# Patient Record
Sex: Female | Born: 1975 | Race: Black or African American | Hispanic: No | Marital: Married | Smoking: Never smoker
Health system: Southern US, Community
[De-identification: ages and names within clinical notes are randomized; demographics above are authoritative.]

## PROBLEM LIST (undated history)

## (undated) DIAGNOSIS — R51 Headache: Secondary | ICD-10-CM

## (undated) DIAGNOSIS — R519 Headache, unspecified: Secondary | ICD-10-CM

## (undated) HISTORY — PX: MYOMECTOMY: SHX85

## (undated) HISTORY — DX: Headache: R51

## (undated) HISTORY — DX: Headache, unspecified: R51.9

---

## 2001-05-01 ENCOUNTER — Other Ambulatory Visit: Admission: RE | Admit: 2001-05-01 | Discharge: 2001-05-01 | Payer: Self-pay | Admitting: Internal Medicine

## 2002-01-07 ENCOUNTER — Ambulatory Visit (HOSPITAL_COMMUNITY): Admission: RE | Admit: 2002-01-07 | Discharge: 2002-01-07 | Payer: Self-pay | Admitting: Internal Medicine

## 2002-01-07 ENCOUNTER — Encounter: Payer: Self-pay | Admitting: Internal Medicine

## 2002-08-02 ENCOUNTER — Ambulatory Visit (HOSPITAL_COMMUNITY): Admission: RE | Admit: 2002-08-02 | Discharge: 2002-08-02 | Payer: Self-pay | Admitting: *Deleted

## 2002-08-02 ENCOUNTER — Encounter: Payer: Self-pay | Admitting: *Deleted

## 2003-05-30 ENCOUNTER — Encounter: Payer: Self-pay | Admitting: Internal Medicine

## 2003-05-30 ENCOUNTER — Ambulatory Visit (HOSPITAL_COMMUNITY): Admission: RE | Admit: 2003-05-30 | Discharge: 2003-05-30 | Payer: Self-pay | Admitting: Internal Medicine

## 2003-09-22 ENCOUNTER — Ambulatory Visit (HOSPITAL_COMMUNITY): Admission: RE | Admit: 2003-09-22 | Discharge: 2003-09-22 | Payer: Self-pay | Admitting: *Deleted

## 2004-03-22 ENCOUNTER — Inpatient Hospital Stay (HOSPITAL_COMMUNITY): Admission: RE | Admit: 2004-03-22 | Discharge: 2004-03-25 | Payer: Self-pay | Admitting: *Deleted

## 2004-03-22 ENCOUNTER — Encounter (INDEPENDENT_AMBULATORY_CARE_PROVIDER_SITE_OTHER): Payer: Self-pay | Admitting: Specialist

## 2006-01-08 ENCOUNTER — Inpatient Hospital Stay (HOSPITAL_COMMUNITY): Admission: AD | Admit: 2006-01-08 | Discharge: 2006-01-08 | Payer: Self-pay | Admitting: Obstetrics and Gynecology

## 2006-02-10 ENCOUNTER — Inpatient Hospital Stay (HOSPITAL_COMMUNITY): Admission: AD | Admit: 2006-02-10 | Discharge: 2006-02-10 | Payer: Self-pay | Admitting: Obstetrics and Gynecology

## 2006-02-11 ENCOUNTER — Observation Stay (HOSPITAL_COMMUNITY): Admission: AD | Admit: 2006-02-11 | Discharge: 2006-02-12 | Payer: Self-pay | Admitting: Obstetrics and Gynecology

## 2006-03-09 ENCOUNTER — Inpatient Hospital Stay (HOSPITAL_COMMUNITY): Admission: AD | Admit: 2006-03-09 | Discharge: 2006-03-09 | Payer: Self-pay | Admitting: Obstetrics and Gynecology

## 2006-03-10 ENCOUNTER — Inpatient Hospital Stay (HOSPITAL_COMMUNITY): Admission: AD | Admit: 2006-03-10 | Discharge: 2006-03-10 | Payer: Self-pay | Admitting: *Deleted

## 2006-04-12 ENCOUNTER — Inpatient Hospital Stay (HOSPITAL_COMMUNITY): Admission: AD | Admit: 2006-04-12 | Discharge: 2006-04-16 | Payer: Self-pay | Admitting: Obstetrics and Gynecology

## 2006-04-12 ENCOUNTER — Encounter (INDEPENDENT_AMBULATORY_CARE_PROVIDER_SITE_OTHER): Payer: Self-pay | Admitting: *Deleted

## 2006-04-17 ENCOUNTER — Encounter: Admission: RE | Admit: 2006-04-17 | Discharge: 2006-05-02 | Payer: Self-pay | Admitting: *Deleted

## 2006-07-10 ENCOUNTER — Encounter: Admission: RE | Admit: 2006-07-10 | Discharge: 2006-08-09 | Payer: Self-pay | Admitting: *Deleted

## 2006-08-10 ENCOUNTER — Encounter: Admission: RE | Admit: 2006-08-10 | Discharge: 2006-09-08 | Payer: Self-pay | Admitting: *Deleted

## 2006-09-09 ENCOUNTER — Encounter: Admission: RE | Admit: 2006-09-09 | Discharge: 2006-09-27 | Payer: Self-pay | Admitting: *Deleted

## 2007-06-20 ENCOUNTER — Inpatient Hospital Stay (HOSPITAL_COMMUNITY): Admission: RE | Admit: 2007-06-20 | Discharge: 2007-06-22 | Payer: Self-pay | Admitting: *Deleted

## 2007-06-25 ENCOUNTER — Encounter: Admission: RE | Admit: 2007-06-25 | Discharge: 2007-07-24 | Payer: Self-pay | Admitting: *Deleted

## 2007-07-25 ENCOUNTER — Encounter: Admission: RE | Admit: 2007-07-25 | Discharge: 2007-08-24 | Payer: Self-pay | Admitting: *Deleted

## 2007-08-25 ENCOUNTER — Encounter: Admission: RE | Admit: 2007-08-25 | Discharge: 2007-09-23 | Payer: Self-pay | Admitting: *Deleted

## 2007-09-24 ENCOUNTER — Encounter: Admission: RE | Admit: 2007-09-24 | Discharge: 2007-10-24 | Payer: Self-pay | Admitting: *Deleted

## 2007-10-25 ENCOUNTER — Encounter: Admission: RE | Admit: 2007-10-25 | Discharge: 2007-11-24 | Payer: Self-pay | Admitting: *Deleted

## 2007-11-25 ENCOUNTER — Encounter: Admission: RE | Admit: 2007-11-25 | Discharge: 2007-12-24 | Payer: Self-pay | Admitting: *Deleted

## 2007-12-26 ENCOUNTER — Encounter: Admission: RE | Admit: 2007-12-26 | Discharge: 2008-01-24 | Payer: Self-pay | Admitting: *Deleted

## 2008-01-25 ENCOUNTER — Encounter: Admission: RE | Admit: 2008-01-25 | Discharge: 2008-02-24 | Payer: Self-pay | Admitting: *Deleted

## 2008-02-25 ENCOUNTER — Encounter: Admission: RE | Admit: 2008-02-25 | Discharge: 2008-03-25 | Payer: Self-pay | Admitting: *Deleted

## 2008-03-26 ENCOUNTER — Encounter: Admission: RE | Admit: 2008-03-26 | Discharge: 2008-04-25 | Payer: Self-pay | Admitting: *Deleted

## 2008-04-26 ENCOUNTER — Encounter: Admission: RE | Admit: 2008-04-26 | Discharge: 2008-05-26 | Payer: Self-pay | Admitting: *Deleted

## 2008-05-27 ENCOUNTER — Encounter: Admission: RE | Admit: 2008-05-27 | Discharge: 2008-06-26 | Payer: Self-pay | Admitting: Obstetrics and Gynecology

## 2008-06-27 ENCOUNTER — Encounter: Admission: RE | Admit: 2008-06-27 | Discharge: 2008-07-27 | Payer: Self-pay | Admitting: Obstetrics and Gynecology

## 2008-07-28 ENCOUNTER — Encounter: Admission: RE | Admit: 2008-07-28 | Discharge: 2008-08-08 | Payer: Self-pay | Admitting: Obstetrics and Gynecology

## 2010-11-17 ENCOUNTER — Other Ambulatory Visit: Payer: Self-pay | Admitting: Obstetrics & Gynecology

## 2011-02-08 NOTE — Discharge Summary (Signed)
[name], [name]                 ACCOUNT NO.:  [id number]   MEDICAL RECORD NO.:  [id number]          PATIENT TYPE:  INP   LOCATION:  9133                          FACILITY:  [facility]   PHYSICIAN:  Gerri Spore B. Earlene Plater, M.D.  DATE OF BIRTH:  [birth year]   DATE OF ADMISSION:  06/20/2007  DATE OF DISCHARGE:  06/22/2007                               DISCHARGE SUMMARY   ADMISSION DIAGNOSES:  1. Thirty-seven intrauterine pregnancy.  2. Previous cesarean section and previous myomectomy.   DISCHARGE DIAGNOSES:  1. Thirty-seven intrauterine pregnancy.  2. Previous cesarean section and previous myomectomy.   PROCEDURES:  Repeat low transverse C-section.   HISTORY OF PRESENT ILLNESS:  A 35 year old African-American female  gravida 3, para 1, A 1 with above history who presented for repeat C-  section.   HOSPITAL COURSE:  On day of admission, the patient underwent repeat low  transverse C-section.  Findings included viable female 8 pounds 2 ounces,  Apgars 8 and 8.  There were adhesions of the bladder to the uterus and  anterior abdominal wall which were taken down sharply without  difficulty.   Postoperatively, the patient rapidly regained her ability to ambulate,  void, and tolerate a regular diet.  She was discharged home on the 2nd  postoperative day in satisfactory condition.   DISCHARGE MEDICATIONS:  Tylox 1-2 p.o. q.4-6h. p.r.n. pain.   DISCHARGE INSTRUCTIONS:  Per booklet.   FOLLOW UP:  Six weeks, [facility].   DISPOSITION AT DISCHARGE:  Satisfactory.      Gerri Spore B. Earlene Plater, M.D.  Electronically Signed     WBD/MEDQ  D:  07/21/2007  T:  07/22/2007  Job:  [id number]

## 2011-02-08 NOTE — Op Note (Signed)
[name], [name]                 ACCOUNT NO.:  [id number]   MEDICAL RECORD NO.:  [id number]          PATIENT TYPE:  INP   LOCATION:  9133                          [facility]ILITY:  [facility]   PHYSICIAN:  Gerri Spore B. Earlene Plater, M.D.  DATE OF BIRTH:  June 05, 1976   DATE OF PROCEDURE:  06/20/2007  DATE OF DISCHARGE:                               OPERATIVE REPORT   PREOPERATIVE DIAGNOSIS:  1. 39 week intrauterine pregnancy.  2. Previous cesarean section and previous myomectomy.   POSTOPERATIVE DIAGNOSIS:  1. 39 week intrauterine pregnancy.  2. Previous cesarean section and previous myomectomy.   PROCEDURE:  Repeat low transverse cesarean section.   SURGEON:  Chester Holstein. Earlene Plater, M.D.   ASSISTANT:  Marlinda Mike, C.N.M.   ANESTHESIA:  Spinal.   FINDINGS:  Bladder adherent to the anterior abdominal wall and uterus,  taken down sharply. Viable female, Apgars 8 and 8, 8 pounds 2 ounces.   SPECIMENS:  None.   COMPLICATIONS:  None.   BLOOD LOSS:  800 mL.   INDICATIONS:  Patient with the above history for repeat C-section. The  patient was advised of the risks of surgery, including infection,  bleeding, and damage to surrounding organs.   PROCEDURE:  The patient was taken to the operating room and spinal  anesthesia obtained.  She was prepped and draped in a standard fashion.  A Foley catheter was inserted into the bladder.  A Pfannenstiel incision  was made through the previous scar.  The fascia was divided sharply and  the underlying rectus muscles dissected off sharply.  The posterior  sheath peritoneum elevated and entered sharply.  The bladder was noted  to be adherent to the uterus and anterior abdominal wall just inferior  to the point of entry.  The adhesions were taken down sharply.  Bladder  blade was inserted and bladder flap created sharply.  A uterine incision  was made in a low transverse fashion with a knife.  It was extended  laterally with bandage scissors.   Clear fluid at amniotomy,  vertex elevated through the incision, nose and  mouth suctioned with the bulb.  Nuchal cord x1 noted. Anterior shoulder,  which was the left, was delivered by flexion of the elbow to facilitate  delivery.  The remainder of the infant delivered without difficulty.  Cord clamped and cut and infant handed off to awaiting pediatricians.  1  gram Ancef given at cord clamp.  The placenta was removed by uterine  massage.  The uterus was cleared of all clots and debris.  The tubes and  ovaries were poorly visualized due to fibroid uterus, however, no  abnormalities by palpation.   The uterine incision was closed in a running lock stitch of 0 chromic.  A few bleeders in the midline were made hemostatic with interrupted  figure-of-eight stitches of the same suture.  The pelvis was irrigated.  The uterine incision and bladder flap inspected and were hemostatic.  The bladder was free of injury.  The fascia was closed with a running  stitch of 0 Vicryl.  The subcutaneous tissue was irrigated, made  hemostatic with the Bovie, and reapproximated with interrupted sutures  of 2-0 plain suture.  The skin was closed with staples.  The patient  tolerated the procedure well with no complications.  She was taken to  the recovery room awake, alert, and in stable condition.  All counts  correct per the operating room staff.      Gerri Spore B. Earlene Plater, M.D.  Electronically Signed     WBD/MEDQ  D:  06/20/2007  T:  06/20/2007  Job:  [id number]

## 2011-02-11 NOTE — H&P (Signed)
NAME:  [name], [name]                           ACCOUNT NO.:  [id number]   MEDICAL RECORD NO.:  [id number]                   PATIENT TYPE:  INP   LOCATION:  NA                                   [facility]  [facility]   PHYSICIAN:  [place] B. Earlene Plater, M.D.               DATE OF BIRTH:  1976-01-06   DATE OF ADMISSION:  DATE OF DISCHARGE:                                HISTORY & PHYSICAL   PREOPERATIVE DIAGNOSES:  1. Uterine fibroids.  2. Complex right ovarian cyst, probable endometrioma.  3. Pelvic adhesions.   HISTORY OF PRESENT ILLNESS:  A 35 year old African-American female, gravida  1, para 0, A 1, with a several month history of a right ovarian cyst which  appears consistent with endometrioma.  Also noted to have a large uterine  fibroid which is anterior and approximately 11 cm in diameter.  Previous HSG  has showed normal cavity and no evidence of tubal occlusion.  However, at  this time, the patient is bothered due to bulk-related symptoms of the  fibroid and is also interested in fertility and was noted to have pelvic  adhesions at time of diagnostic laparoscopy.  __________ bowel preparation  for laparotomy with abdominal myomectomy and ovarian cystectomy with lysis  of adhesions.   PAST MEDICAL HISTORY:  None.   PAST SURGICAL HISTORY:  Diagnostic laparoscopy as outlined above.   MEDICATIONS:  None.   ALLERGIES:  None.   SOCIAL HISTORY:  Noncontributory.   FAMILY HISTORY:  Diabetes, heart disease, and lung cancer.   REVIEW OF SYSTEMS:  Otherwise negative.   PHYSICAL EXAMINATION:  VITAL SIGNS:  The patient is noted to be afebrile  with stable vital signs.  GENERAL:  Alert and oriented, in no acute distress.  SKIN:  Warm, dry, with no lesions.  HEART:  Regular rate and rhythm.  LUNGS:  Clear to auscultation.  ABDOMEN:  Liver and spleen are normal.  There is a palpable mass to the  umbilicus consistent with known uterine fibroids.  PELVIC:  Large anterior fibroid palpable.   Otherwise, no abnormality.   ASSESSMENT:  Symptomatic uterine fibroid and persistent complex right  ovarian cyst.   PLAN:  Laparotomy after bowel prep with ovarian cystectomy, lysis of  adhesions, and abdominal myomectomy.  Operative risks discussed including  infection, bleeding, damage to bowel and bladder and surrounding organs and  potential need to convert to hysterectomy should life-threatening bleeding  arise.  In addition, it was discussed with the patient that it may not be  possible to completely treat the underlying pelvic pathology and that tubal  removal may be necessary as an ovarian removal should the clinical situation  necessitate.  All questions answered.  The patient wishes to proceed.  Gerri Spore B. Earlene Plater, M.D.    WBD/MEDQ  D:  03/21/2004  T:  03/21/2004  Job:  [id number]

## 2011-07-07 LAB — CBC
MCHC: 35.3
RBC: 3.12 — ABNORMAL LOW
RBC: 4.38
RDW: 13.4
WBC: 13.8 — ABNORMAL HIGH

## 2011-07-07 LAB — DIFFERENTIAL
Basophils Absolute: 0
Basophils Relative: 0
Neutro Abs: 7.2
Neutrophils Relative %: 74

## 2011-07-07 LAB — CCBB MATERNAL DONOR DRAW

## 2012-12-19 ENCOUNTER — Other Ambulatory Visit (HOSPITAL_COMMUNITY)
Admission: RE | Admit: 2012-12-19 | Discharge: 2012-12-19 | Disposition: A | Discharge status: Home / Self Care | Payer: BC Managed Care – PPO | Source: Ambulatory Visit | Attending: Obstetrics and Gynecology | Admitting: Obstetrics and Gynecology

## 2012-12-19 ENCOUNTER — Other Ambulatory Visit: Payer: Self-pay | Admitting: Obstetrics and Gynecology

## 2012-12-19 DIAGNOSIS — Z113 Encounter for screening for infections with a predominantly sexual mode of transmission: Secondary | ICD-10-CM | POA: Insufficient documentation

## 2012-12-19 DIAGNOSIS — Z1151 Encounter for screening for human papillomavirus (HPV): Secondary | ICD-10-CM | POA: Insufficient documentation

## 2012-12-19 DIAGNOSIS — Z01419 Encounter for gynecological examination (general) (routine) without abnormal findings: Secondary | ICD-10-CM | POA: Insufficient documentation

## 2013-01-29 ENCOUNTER — Other Ambulatory Visit: Payer: Self-pay | Admitting: Obstetrics and Gynecology

## 2013-08-02 ENCOUNTER — Other Ambulatory Visit: Payer: Self-pay | Admitting: Obstetrics and Gynecology

## 2013-08-02 ENCOUNTER — Other Ambulatory Visit (HOSPITAL_COMMUNITY)
Admission: RE | Admit: 2013-08-02 | Discharge: 2013-08-02 | Disposition: A | Discharge status: Home / Self Care | Payer: BC Managed Care – PPO | Source: Ambulatory Visit | Attending: Obstetrics and Gynecology | Admitting: Obstetrics and Gynecology

## 2013-08-02 DIAGNOSIS — Z01419 Encounter for gynecological examination (general) (routine) without abnormal findings: Secondary | ICD-10-CM | POA: Insufficient documentation

## 2013-08-02 DIAGNOSIS — R87810 Cervical high risk human papillomavirus (HPV) DNA test positive: Secondary | ICD-10-CM | POA: Insufficient documentation

## 2013-08-02 DIAGNOSIS — Z1151 Encounter for screening for human papillomavirus (HPV): Secondary | ICD-10-CM | POA: Insufficient documentation

## 2013-12-23 ENCOUNTER — Other Ambulatory Visit: Payer: Self-pay | Admitting: Obstetrics and Gynecology

## 2013-12-23 ENCOUNTER — Other Ambulatory Visit (HOSPITAL_COMMUNITY)
Admission: RE | Admit: 2013-12-23 | Discharge: 2013-12-23 | Disposition: A | Discharge status: Home / Self Care | Payer: BC Managed Care – PPO | Source: Ambulatory Visit | Attending: Obstetrics and Gynecology | Admitting: Obstetrics and Gynecology

## 2013-12-23 DIAGNOSIS — R87810 Cervical high risk human papillomavirus (HPV) DNA test positive: Secondary | ICD-10-CM | POA: Insufficient documentation

## 2013-12-23 DIAGNOSIS — Z1151 Encounter for screening for human papillomavirus (HPV): Secondary | ICD-10-CM | POA: Insufficient documentation

## 2013-12-23 DIAGNOSIS — Z113 Encounter for screening for infections with a predominantly sexual mode of transmission: Secondary | ICD-10-CM | POA: Insufficient documentation

## 2013-12-23 DIAGNOSIS — Z124 Encounter for screening for malignant neoplasm of cervix: Secondary | ICD-10-CM | POA: Insufficient documentation

## 2014-06-26 ENCOUNTER — Other Ambulatory Visit (HOSPITAL_COMMUNITY)
Admission: RE | Admit: 2014-06-26 | Discharge: 2014-06-26 | Disposition: A | Discharge status: Home / Self Care | Payer: BC Managed Care – PPO | Source: Ambulatory Visit | Attending: Obstetrics and Gynecology | Admitting: Obstetrics and Gynecology

## 2014-06-26 ENCOUNTER — Other Ambulatory Visit: Payer: Self-pay | Admitting: Obstetrics and Gynecology

## 2014-06-26 DIAGNOSIS — Z124 Encounter for screening for malignant neoplasm of cervix: Secondary | ICD-10-CM | POA: Diagnosis not present

## 2014-06-26 DIAGNOSIS — R87810 Cervical high risk human papillomavirus (HPV) DNA test positive: Secondary | ICD-10-CM | POA: Diagnosis present

## 2014-06-26 DIAGNOSIS — Z1151 Encounter for screening for human papillomavirus (HPV): Secondary | ICD-10-CM | POA: Insufficient documentation

## 2014-06-26 DIAGNOSIS — R87619 Unspecified abnormal cytological findings in specimens from cervix uteri: Secondary | ICD-10-CM | POA: Diagnosis present

## 2014-06-27 LAB — CYTOLOGY - PAP

## 2014-10-07 ENCOUNTER — Emergency Department (HOSPITAL_COMMUNITY)
Admission: EM | Admit: 2014-10-07 | Discharge: 2014-10-07 | Disposition: A | Discharge status: Home / Self Care | Payer: BC Managed Care – PPO | Attending: Emergency Medicine | Admitting: Emergency Medicine

## 2014-10-07 ENCOUNTER — Emergency Department (HOSPITAL_COMMUNITY): Payer: BC Managed Care – PPO

## 2014-10-07 ENCOUNTER — Encounter (HOSPITAL_COMMUNITY): Payer: Self-pay | Admitting: Adult Health

## 2014-10-07 DIAGNOSIS — R000 Tachycardia, unspecified: Secondary | ICD-10-CM | POA: Insufficient documentation

## 2014-10-07 DIAGNOSIS — R51 Headache: Secondary | ICD-10-CM | POA: Diagnosis present

## 2014-10-07 DIAGNOSIS — R519 Headache, unspecified: Secondary | ICD-10-CM

## 2014-10-07 DIAGNOSIS — M542 Cervicalgia: Secondary | ICD-10-CM | POA: Insufficient documentation

## 2014-10-07 DIAGNOSIS — R200 Anesthesia of skin: Secondary | ICD-10-CM | POA: Diagnosis not present

## 2014-10-07 DIAGNOSIS — Z791 Long term (current) use of non-steroidal anti-inflammatories (NSAID): Secondary | ICD-10-CM | POA: Insufficient documentation

## 2014-10-07 DIAGNOSIS — Z79899 Other long term (current) drug therapy: Secondary | ICD-10-CM | POA: Diagnosis not present

## 2014-10-07 LAB — BASIC METABOLIC PANEL
Anion gap: 9 (ref 5–15)
BUN: 11 mg/dL (ref 6–23)
CO2: 25 mmol/L (ref 19–32)
Calcium: 9.4 mg/dL (ref 8.4–10.5)
Chloride: 104 mEq/L (ref 96–112)
Creatinine, Ser: 1.11 mg/dL — ABNORMAL HIGH (ref 0.50–1.10)
GFR calc Af Amer: 71 mL/min — ABNORMAL LOW (ref >90)
GFR calc non Af Amer: 61 mL/min — ABNORMAL LOW (ref >90)
Glucose, Bld: 96 mg/dL (ref 70–99)
Potassium: 3.7 mmol/L (ref 3.5–5.1)
Sodium: 138 mmol/L (ref 135–145)

## 2014-10-07 LAB — CBC WITH DIFFERENTIAL/PLATELET
Basophils Absolute: 0 10*3/uL (ref 0.0–0.1)
Basophils Relative: 0 % (ref 0–1)
Eosinophils Absolute: 0.1 10*3/uL (ref 0.0–0.7)
Eosinophils Relative: 1 % (ref 0–5)
HCT: 41.1 % (ref 36.0–46.0)
Hemoglobin: 14.1 g/dL (ref 12.0–15.0)
Lymphocytes Relative: 40 % (ref 12–46)
Lymphs Abs: 2.9 10*3/uL (ref 0.7–4.0)
MCH: 31.7 pg (ref 26.0–34.0)
MCHC: 34.3 g/dL (ref 30.0–36.0)
MCV: 92.4 fL (ref 78.0–100.0)
Monocytes Absolute: 0.4 10*3/uL (ref 0.1–1.0)
Monocytes Relative: 5 % (ref 3–12)
Neutro Abs: 3.9 10*3/uL (ref 1.7–7.7)
Neutrophils Relative %: 54 % (ref 43–77)
Platelets: 296 10*3/uL (ref 150–400)
RBC: 4.45 MIL/uL (ref 3.87–5.11)
RDW: 13 % (ref 11.5–15.5)
WBC: 7.2 10*3/uL (ref 4.0–10.5)

## 2014-10-07 LAB — CBG MONITORING, ED: GLUCOSE-CAPILLARY: 94 mg/dL (ref 70–99)

## 2014-10-07 MED ORDER — ASPIRIN-ACETAMINOPHEN-CAFFEINE 250-250-65 MG PO TABS: 2.0000 | ORAL_TABLET | Freq: Four times a day (QID) | ORAL | Status: AC | PRN

## 2014-10-07 NOTE — ED Notes (Signed)
Pt made aware to return if symptoms worsen or if any life threatening symptoms occur.   

## 2014-10-07 NOTE — ED Notes (Signed)
Pt reports neck pain radiating into head and right face x1 week.  Pt states going to PCP yesterday and given antiinflammatory and muscle relaxer with relief of headache.  Pt denies loc, dizziness, vision changes.

## 2014-10-07 NOTE — ED Provider Notes (Signed)
CSN: [id number]     Arrival date & time 10/07/14  1643 History   First MD Initiated Contact with Patient 10/07/14 1658     Chief Complaint  Patient presents with  . Headache     (Consider location/radiation/quality/duration/timing/severity/associated sxs/prior Treatment) HPI  Pt is a 39yo female presenting to ED with c/o intermittent episodes of right sided neck pain and headache. Pt reports for 1 week, she has noticed right sided neck pain that radiates into her head causing a throbbing headache, 5/10 at worst.  Pt states "I don't feel dizzy or nauseated, but I just feel off. I can't explain it."  Pain in right side of neck also causes intermittent numbness on right side of face.  Denies change in vision, photophobia, or passing out.  She was seen by her PCP yesterday, given an antiinflammatory and muscle relaxer which did provide moderate relief of her headache but still reports "waves" of this "sensation" on the right side of her neck. Denies hx of migraines. Denies hx of HTN, hyperlipidemia, DM, CAD. Denies palpitations or SOB. Denies recent head injuries or illness including fever, n/v/d.   History reviewed. No pertinent past medical history. History reviewed. No pertinent past surgical history. History reviewed. No pertinent family history. History  Substance Use Topics  . Smoking status: Never Smoker   . Smokeless tobacco: Not on file  . Alcohol Use: No   OB History    No data available     Review of Systems  Constitutional: Negative for fever and chills.  HENT: Negative for congestion, sore throat, trouble swallowing and voice change.   Eyes: Negative for photophobia and visual disturbance.  Respiratory: Negative for cough and shortness of breath.   Cardiovascular: Negative for chest pain and palpitations.  Musculoskeletal: Positive for neck pain. Negative for myalgias, back pain, arthralgias and neck stiffness.  Neurological: Positive for numbness and headaches. Negative for  dizziness, tremors, seizures, syncope, facial asymmetry, speech difficulty, weakness and light-headedness.  All other systems reviewed and are negative.     Allergies  Review of patient's allergies indicates no known allergies.  Home Medications   Prior to Admission medications   Medication Sig Start Date End Date Taking? Authorizing Provider  cyclobenzaprine (FLEXERIL) 10 MG tablet Take 10 mg by mouth 3 (three) times daily as needed for muscle spasms.   Yes Historical Provider, MD  diclofenac (VOLTAREN) 50 MG EC tablet Take 50 mg by mouth 2 (two) times daily.   Yes Historical Provider, MD  norethindrone-ethinyl estradiol (JUNEL FE,GILDESS FE,LOESTRIN FE) 1-20 MG-MCG tablet Take 1 tablet by mouth daily.   Yes Historical Provider, MD  aspirin-acetaminophen-caffeine (EXCEDRIN MIGRAINE) 2521634396 MG per tablet Take 2 tablets by mouth every 6 (six) hours as needed for headache. 10/07/14   Junius Finner, PA-C   BP 117/64 mmHg  Pulse 91  Temp(Src) 98.4 F (36.9 C)  Resp 24  Ht  (1.575 m)  Wt 164 lb (74.39 kg)  BMI 29.99 kg/m2  SpO2 100%  LMP 09/12/2014 Physical Exam  Constitutional: She is oriented to person, place, and time. She appears well-developed and well-nourished. No distress.  HENT:  Head: Normocephalic and atraumatic.  Eyes: Conjunctivae and EOM are normal. Pupils are equal, round, and reactive to light. No scleral icterus.  Neck: Normal range of motion. Neck supple. No JVD present. No tracheal deviation present. No thyromegaly present.  No midline bone tenderness, no crepitus or step-offs.  No nuchal rigidity or meningeal signs.  Cardiovascular: Regular rhythm and normal  heart sounds.  Tachycardia present.   Pulses:      Carotid pulses are 2+ on the right side, and 2+ on the left side.      Radial pulses are 2+ on the right side, and 2+ on the left side.  No carotid bruit  Pulmonary/Chest: Effort normal and breath sounds normal. No stridor. No respiratory distress.  She has no wheezes. She has no rales. She exhibits no tenderness.  Abdominal: Soft. Bowel sounds are normal. She exhibits no distension and no mass. There is no tenderness. There is no rebound and no guarding.  Musculoskeletal: Normal range of motion.  FROM all extremities. 5/5 strength in upper and lower extremities bilaterally.   Lymphadenopathy:    She has no cervical adenopathy.  Neurological: She is alert and oriented to person, place, and time. No cranial nerve deficit. Coordination normal.  CN II-XII in tact. Alert to person, place and time. Speech is fluent. Normal finger to nose coordination. Normal gait.   Skin: Skin is warm and dry. No rash noted. She is not diaphoretic.  Nursing note and vitals reviewed.   ED Course  Procedures (including critical care time) Labs Review Labs Reviewed  BASIC METABOLIC PANEL - Abnormal; Notable for the following:    Creatinine, Ser 1.11 (*)    GFR calc non Af Amer 61 (*)    GFR calc Af Amer 71 (*)    All other components within normal limits  CBC WITH DIFFERENTIAL  CBG MONITORING, ED    Imaging Review Ct Head Wo Contrast  10/07/2014   CLINICAL DATA:  Right facial pain and numbness for 1 week  EXAM: CT HEAD WITHOUT CONTRAST  TECHNIQUE: Contiguous axial images were obtained from the base of the skull through the vertex without intravenous contrast.  COMPARISON:  None.  FINDINGS: No skull fracture is noted. Paranasal sinuses and mastoid air cells are unremarkable.  No intracranial hemorrhage, mass effect or midline shift. No acute cortical infarction. No mass lesion is noted on this unenhanced scan. No intra or extra-axial fluid collection. No hydrocephalus.  IMPRESSION: No acute intracranial abnormality.   Electronically Signed   By: Natasha MeadLiviu  Pop M.D.   On: 10/07/2014 19:41     EKG Interpretation   Date/Time:  Tuesday October 07 2014 17:11:11 EST Ventricular Rate:  96 PR Interval:  155 QRS Duration: 79 QT Interval:  359 QTC Calculation:  454 R Axis:   32 Text Interpretation:  Sinus rhythm q waves in V1, V2 Confirmed by Lincoln Brighamees,  Liz [phone]([id number]) on 10/07/2014 6:55:05 PM      MDM   Final diagnoses:  Neck pain on right side  Recurrent headache    Pt is a 39yo female c/o intermittent right sided neck pain radiating into her head for 1 week. No hx of migraines. Headaches are gradual in onset.  On exam, pt appears well, NAD. No focal neuro deficits.  Headache is atypical for a migraines, considered carotid U/S but doubt dissection.  No carotid bruit on exam. Pain likely muscular in nature at this time.  Discussed pt with Dr. Madilyn Hookees who also examined pt.  CT head and basic labs ordered, doubt emergent process taking place at this time.  Labs and imaging- unremarkable.  Pt hemodynamically stable for discharge home to f/u with PCP for possible out patient Carotid U/S.  Also provided resource for [facility]. Return precautions provided. Pt verbalized understanding and agreement with tx plan.     Junius Finnerrin O'Malley, PA-C 10/07/14 2004  Elizabeth  Madilyn Hook, MD 10/07/14 [phone]

## 2014-10-07 NOTE — ED Notes (Signed)
Presents with one week of intermittent right sided facial pain and numbness. The pain shoots up from the right side of her neck and radiates into right side of head behind eye-causing facial numnbess and tingling on the right side. She saw her doctor yesterday and was given muscle relaxer and anti inflammatory which did not help. She reports that she feels "off, and I just can't explain it. I don't feel like I am going pass out or like everything is spinning, but I just feel off" she is alert, oriented and has no neurological deficits.

## 2014-10-07 NOTE — ED Notes (Signed)
Called CT inquiring about CT results.

## 2014-10-07 NOTE — ED Notes (Signed)
Patient CBG was [id number] Nurse was informed.

## 2014-10-08 LAB — CBG MONITORING, ED: GLUCOSE-CAPILLARY: 89 mg/dL (ref 70–99)

## 2014-11-20 ENCOUNTER — Encounter: Payer: Self-pay | Admitting: *Deleted

## 2014-11-28 ENCOUNTER — Ambulatory Visit (INDEPENDENT_AMBULATORY_CARE_PROVIDER_SITE_OTHER): Payer: BC Managed Care – PPO | Admitting: Neurology

## 2014-11-28 ENCOUNTER — Encounter: Payer: Self-pay | Admitting: Neurology

## 2014-11-28 VITALS — BP 140/84 | HR 104 | Resp 20 | Ht 62.0 in | Wt 166.6 lb

## 2014-11-28 DIAGNOSIS — G44209 Tension-type headache, unspecified, not intractable: Secondary | ICD-10-CM

## 2014-11-28 MED ORDER — PREDNISONE 10 MG PO TABS: ORAL_TABLET | ORAL | Status: AC

## 2014-11-28 NOTE — Patient Instructions (Addendum)
I think these are tension type headaches, but we will rule out other causes. 1.  We will get an MRI and MRA of the head. [facility]12/2014 at 1:00 pm. Please arrive 15 minutes prior and go to 1st floor radiology. If you need to reschedule for any reason please call [phone]. 2.  I will prescribe you a prednisone taper to break the frequency of these headaches.  Take 6tabs x1day, then 5tabs x1day, then 4tabs x1day, then 3tabs x1day, then 2tabs x1day, then 1tab x1day, then STOP.  Do not take Advil or other NSAID while on this. 3.  After finishing the prednisone, you may take Advil for the headache, but limit to no more than 2 days out of the week 4.  Call in 4 weeks with update (or sooner if needed) 5.  Follow up in 3 months.

## 2014-11-28 NOTE — Progress Notes (Signed)
NEUROLOGY CONSULTATION NOTE  Leda MinDena K Carter MRN: [id number] DOB: [birth year]  Referring provider: Dr. Manus GunningEhinger Primary care provider: Dr. Manus GunningEhinger  Reason for consult:  headache  HISTORY OF PRESENT ILLNESS: [name] is a 39 year old right-handed woman who presents for headache.  Records from ED and PCP, labs and CT of head reviewed.  Onset:  09/26/2014.  She denies preceding injury or change in medications. Location:  Temples bilaterally, worse on the right with tightness sometimes radiating down the right side of neck.  Initially they woke her up at night.   Quality:  "like contractions" in her head and tightness in the neck Intensity:  Initially 8/10, now 5/10 Aura:  no Prodrome:  no Associated symptoms:  Initially, she had pain and numbness down the right arm and hand, which has since resolved.  Sometimes lightheadedness.  No nausea, photophobia, phonophobia, visual disturbance or autonomic symptoms. Duration:  Few seconds intermittently throughout the day Frequency:  Varies.  She was fine for last 2 weeks but they have been daily for past week. Triggers/exacerbating factors:  Lack of sleep, stress Relieving factors:  Flexeril and Advil help Activity:  Able to function  Past abortive therapy:  Flexeril (helped) Past preventative therapy:  none  Current abortive therapy:  Advil occasionally Current preventative therapy:  none  CT of the head performed on 10/07/14 was unremarkable.  Caffeine:  Started new diet and stopped this week Alcohol:  no Smoker:  no Diet:  Started new diet Exercise:  Just started. 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 Sleep hygiene:  Due to obligations, does not get full night sleep Family history of headache:  Daughter has migraine.  Mother had benign pituitary tumor.  No family history of intracranial  aneurysms No personal history of headache  PAST MEDICAL HISTORY: Past Medical History  Diagnosis Date  . Headache     PAST SURGICAL HISTORY: Past Surgical History  Procedure Laterality Date  . Myomectomy      MEDICATIONS: Current Outpatient Prescriptions on File Prior to Visit  Medication Sig Dispense Refill  . norethindrone-ethinyl estradiol (JUNEL FE,GILDESS FE,LOESTRIN FE) 1-20 MG-MCG tablet Take 1 tablet by mouth daily.    Marland Kitchen. aspirin-acetaminophen-caffeine (EXCEDRIN MIGRAINE) 250-250-65 MG per tablet Take 2 tablets by mouth every 6 (six) hours as needed for headache. (Patient not taking: Reported on 11/28/2014) 30 tablet 0  . cyclobenzaprine (FLEXERIL) 10 MG tablet Take 10 mg by mouth 3 (three) times daily as needed for muscle spasms.    . diclofenac (VOLTAREN) 50 MG EC tablet Take 50 mg by mouth 2 (two) times daily.     No current facility-administered medications on file prior to visit.    ALLERGIES: No Known Allergies  FAMILY HISTORY: Family History  Problem Relation Age of Onset  . Cancer Father     prostate  . Migraines Daughter     SOCIAL HISTORY: History   Social History  . Marital Status: Married    Spouse Name: N/A  . Number of Children: N/A  . Years of Education: N/A   Occupational History  . Not on file.   Social History Main Topics  . Smoking status: Never Smoker   . Smokeless tobacco: Never Used  . Alcohol Use: No  . Drug Use: No  . Sexual Activity:    Partners: Male   Other Topics Concern  .  Not on file   Social History Narrative    REVIEW OF SYSTEMS: Constitutional: No fevers, chills, or sweats, no generalized fatigue, change in appetite Eyes: No visual changes, double vision, eye pain Ear, nose and throat: No hearing loss, ear pain, nasal congestion, sore throat Cardiovascular: No chest pain, palpitations Respiratory:  No shortness of breath at rest or with exertion, wheezes GastrointestinaI: No nausea, vomiting, diarrhea,  abdominal pain, fecal incontinence Genitourinary:  No dysuria, urinary retention or frequency Musculoskeletal:  No neck pain, back pain Integumentary: No rash, pruritus, skin lesions Neurological: as above Psychiatric: No depression, insomnia, anxiety Endocrine: No palpitations, fatigue, diaphoresis, mood swings, change in appetite, change in weight, increased thirst Hematologic/Lymphatic:  No anemia, purpura, petechiae. Allergic/Immunologic: no itchy/runny eyes, nasal congestion, recent allergic reactions, rashes  PHYSICAL EXAM: Filed Vitals:   11/28/14 0816  BP: 140/84  Pulse: 104  Resp: 20   General: No acute distress Head:  Normocephalic/atraumatic Eyes:  fundi unremarkable, without vessel changes, exudates, hemorrhages or papilledema. Neck: supple, no paraspinal tenderness, full range of motion Back: No paraspinal tenderness Heart: regular rate and rhythm Lungs: Clear to auscultation bilaterally. Vascular: No carotid bruits. Neurological Exam: Mental status: alert and oriented to person, place, and time, recent and remote memory intact, fund of knowledge intact, attention and concentration intact, speech fluent and not dysarthric, language intact. Cranial nerves: CN I: not tested CN II: pupils equal, round and reactive to light, visual fields intact, fundi unremarkable, without vessel changes, exudates, hemorrhages or papilledema. CN III, IV, VI:  full range of motion, no nystagmus, no ptosis CN V: facial sensation intact CN VII: upper and lower face symmetric CN VIII: hearing intact CN IX, X: gag intact, uvula midline CN XI: sternocleidomastoid and trapezius muscles intact CN XII: tongue midline Bulk & Tone: normal, no fasciculations. Motor:  5/5 throughout Sensation:  Temperature and vibration intact Deep Tendon Reflexes:  2+ throughout, toes downgoing Finger to nose testing:  No dysmetria Heel to shin:  No dysmetria Gait:  Normal station and stride.  Able to turn  and walk in tandem. Romberg negative.  IMPRESSION: Probable tension type headaches.  But since these are new and she described waking up due to them, we must rule out other causes.  Current increase frequency may be secondary to caffeine withdrawal.  PLAN: 1.  Will get MRI and MRA of head 2.  Prescribe prednisone taper to break the headache 3.  After finishing taper, may take Advil as needed but limit to no more than 2 days out of the week 4.  Follow up in 3 months but she is to call in 4 weeks or sooner with update.  Thank you for allowing me to take part in the care of this patient.  Shon Millet, DO  CC:  Blair Heys, MD

## 2014-12-08 ENCOUNTER — Telehealth: Payer: Self-pay | Admitting: Neurology

## 2014-12-08 NOTE — Telephone Encounter (Signed)
Pt called wanting to see if Dr. Everlena CooperJaffe can Rx her something before she has her MRI on Friday for her nerves. C/b [phone]

## 2014-12-09 ENCOUNTER — Other Ambulatory Visit: Payer: Self-pay | Admitting: *Deleted

## 2014-12-09 MED ORDER — DIAZEPAM 5 MG PO TABS: ORAL_TABLET | ORAL | Status: AC

## 2014-12-09 NOTE — Telephone Encounter (Signed)
Please advise 

## 2014-12-09 NOTE — Telephone Encounter (Signed)
She can take Valium 5mg  20-30 minutes prior to the MRI.  She cannot drive afterwards so she needs somebody to drive her home afterwards.

## 2014-12-09 NOTE — Telephone Encounter (Signed)
rx sent to pharmacy patient advised not to drive but to accompanyed buy someone 18 or older

## 2014-12-12 ENCOUNTER — Ambulatory Visit (HOSPITAL_COMMUNITY)
Admission: RE | Admit: 2014-12-12 | Discharge: 2014-12-12 | Disposition: A | Discharge status: Home / Self Care | Payer: BC Managed Care – PPO | Source: Ambulatory Visit | Attending: Neurology | Admitting: Neurology

## 2014-12-12 DIAGNOSIS — G44209 Tension-type headache, unspecified, not intractable: Secondary | ICD-10-CM | POA: Diagnosis not present

## 2014-12-12 DIAGNOSIS — R51 Headache: Secondary | ICD-10-CM | POA: Diagnosis present

## 2014-12-12 MED ORDER — GADOBENATE DIMEGLUMINE 529 MG/ML IV SOLN
15.0000 mL | Freq: Once | INTRAVENOUS | Status: AC
  Administered 2014-12-12: 15 mL via INTRAVENOUS

## 2014-12-15 ENCOUNTER — Telehealth: Payer: Self-pay | Admitting: Neurology

## 2014-12-15 NOTE — Telephone Encounter (Signed)
I called and spoke with Ms. [name] regarding results of MRI and MRA of the brain.  There is no concerning cause for headache.  Incidentally, there are two small cysts seen on the pineal gland.  They are likely benign but I recommend repeating MRI of the brain with and without contrast in one year for monitoring.

## 2014-12-30 ENCOUNTER — Other Ambulatory Visit (HOSPITAL_COMMUNITY)
Admission: RE | Admit: 2014-12-30 | Discharge: 2014-12-30 | Disposition: A | Discharge status: Home / Self Care | Payer: BC Managed Care – PPO | Source: Ambulatory Visit | Attending: Obstetrics and Gynecology | Admitting: Obstetrics and Gynecology

## 2014-12-30 ENCOUNTER — Other Ambulatory Visit: Payer: Self-pay | Admitting: Obstetrics and Gynecology

## 2014-12-30 DIAGNOSIS — R87810 Cervical high risk human papillomavirus (HPV) DNA test positive: Secondary | ICD-10-CM | POA: Insufficient documentation

## 2014-12-30 DIAGNOSIS — Z01411 Encounter for gynecological examination (general) (routine) with abnormal findings: Secondary | ICD-10-CM | POA: Insufficient documentation

## 2014-12-30 DIAGNOSIS — Z1151 Encounter for screening for human papillomavirus (HPV): Secondary | ICD-10-CM | POA: Insufficient documentation

## 2014-12-31 LAB — CYTOLOGY - PAP

## 2015-01-27 ENCOUNTER — Other Ambulatory Visit: Payer: Self-pay | Admitting: Obstetrics and Gynecology

## 2015-03-06 ENCOUNTER — Ambulatory Visit (INDEPENDENT_AMBULATORY_CARE_PROVIDER_SITE_OTHER): Payer: BC Managed Care – PPO | Admitting: Neurology

## 2015-03-06 ENCOUNTER — Encounter: Payer: Self-pay | Admitting: Neurology

## 2015-03-06 VITALS — BP 118/68 | HR 100 | Ht 62.0 in | Wt 158.5 lb

## 2015-03-06 DIAGNOSIS — E348 Other specified endocrine disorders: Secondary | ICD-10-CM

## 2015-03-06 DIAGNOSIS — G44219 Episodic tension-type headache, not intractable: Secondary | ICD-10-CM | POA: Insufficient documentation

## 2015-03-06 DIAGNOSIS — E236 Other disorders of pituitary gland: Secondary | ICD-10-CM | POA: Insufficient documentation

## 2015-03-06 MED ORDER — CYCLOBENZAPRINE HCL 10 MG PO TABS: 10.0000 mg | ORAL_TABLET | Freq: Three times a day (TID) | ORAL | Status: DC | PRN

## 2015-03-06 NOTE — Progress Notes (Signed)
NEUROLOGY FOLLOW UP OFFICE NOTE  [name] [id number]  HISTORY OF PRESENT ILLNESS: [name] is a 39 year old right-handed woman who follows up for probable tension-type headaches.  MRI and MRA of head reviewed.  UPDATE: She had been headache-free up until 2 weeks ago, when she had headache daily for 1.5 weeks.  She took Advil, which helped but the headache would return the next day.  It gradually got better.  She does report increased stress lately as she is now writing her thesis for her doctorate in addition to working full-time and being a single mother.  She is feeling well now.  No right arm numbness or pain.  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  MRA of head was negative.  HISTORY: Onset:  09/26/2014.  She denies preceding injury or change in medications. Location:  Temples bilaterally, worse on the right with tightness sometimes radiating down the right side of neck.  Quality:  "like contractions" in her head and tightness in the neck Initial Intensity:  Initially 8/10, March 5/10 Aura:  no Prodrome:  no Associated symptoms:  Initially, she had pain and numbness down the right arm and hand, which has since resolved.  Sometimes lightheadedness.  No nausea, photophobia, phonophobia, visual disturbance or autonomic symptoms. Initial Duration:  Few seconds intermittently throughout the day Frequency:  Varies.  She was fine for last 2 weeks but they have been daily for past week. Triggers/exacerbating factors:  Lack of sleep, stress Relieving factors:  Flexeril and Advil help Activity:  Able to function  Past abortive therapy:  Flexeril (helped) Past preventative therapy:  none  Current abortive therapy:  Advil occasionally Current preventative therapy:  none  CT of the head performed on 10/07/14 was unremarkable.  Caffeine:  Started new diet and stopped  this week Alcohol:  no Smoker:  no Diet:  Started new diet Exercise:  Just started. 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 Sleep hygiene:  Due to obligations, does not get full night sleep Family history of headache:  Daughter has migraine.  Mother had benign pituitary tumor.  No family history of intracranial aneurysms  PAST MEDICAL HISTORY: Past Medical History  Diagnosis Date  . Headache     MEDICATIONS: Current Outpatient Prescriptions on File Prior to Visit  Medication Sig Dispense Refill  . norethindrone-ethinyl estradiol (JUNEL FE,GILDESS FE,LOESTRIN FE) 1-20 MG-MCG tablet Take 1 tablet by mouth daily.    . predniSONE (DELTASONE) 10 MG tablet Take 6tabs x1day, then 5tabs x1day, then 4tabs x1day, then 3tabs x1day, then 2tabs x1day, then 1tab x1day, then STOP 21 tablet 0  . aspirin-acetaminophen-caffeine (EXCEDRIN MIGRAINE) 250-250-65 MG per tablet Take 2 tablets by mouth every 6 (six) hours as needed for headache. (Patient not taking: Reported on 03/06/2015) 30 tablet 0  . diazepam (VALIUM) 5 MG tablet Take 20 to 30 minutes prior to MRI (Patient not taking: Reported on 03/06/2015) 1 tablet 0  . diclofenac (VOLTAREN) 50 MG EC tablet Take 50 mg by mouth 2 (two) times daily.     No current facility-administered medications on file prior to visit.    ALLERGIES: No Known Allergies  FAMILY HISTORY: Family History  Problem Relation Age of Onset  . Cancer Father  prostate  . Migraines Daughter     SOCIAL HISTORY: History   Social History  . Marital Status: Married    Spouse Name: N/A  . Number of Children: N/A  . Years of Education: N/A   Occupational History  . Not on file.   Social History Main Topics  . Smoking status: Never Smoker   . Smokeless tobacco: Never Used  . Alcohol Use: No  . Drug Use: No  . Sexual Activity:     Partners: Male   Other Topics Concern  . Not on file   Social History Narrative    REVIEW OF SYSTEMS: Constitutional: No fevers, chills, or sweats, no generalized fatigue, change in appetite Eyes: No visual changes, double vision, eye pain Ear, nose and throat: No hearing loss, ear pain, nasal congestion, sore throat Cardiovascular: No chest pain, palpitations Respiratory:  No shortness of breath at rest or with exertion, wheezes GastrointestinaI: No nausea, vomiting, diarrhea, abdominal pain, fecal incontinence Genitourinary:  No dysuria, urinary retention or frequency Musculoskeletal:  No neck pain, back pain Integumentary: No rash, pruritus, skin lesions Neurological: as above Psychiatric: No depression, insomnia, anxiety Endocrine: No palpitations, fatigue, diaphoresis, mood swings, change in appetite, change in weight, increased thirst Hematologic/Lymphatic:  No anemia, purpura, petechiae. Allergic/Immunologic: no itchy/runny eyes, nasal congestion, recent allergic reactions, rashes  PHYSICAL EXAM: Filed Vitals:   03/06/15 0901  BP: 118/68  Pulse: 100   General: No acute distress Head:  Normocephalic/atraumatic Eyes:  Fundoscopic exam unremarkable without vessel changes, exudates, hemorrhages or papilledema. Neck: supple, no paraspinal tenderness, full range of motion Heart:  Regular rate and rhythm Lungs:  Clear to auscultation bilaterally Back: No paraspinal tenderness Neurological Exam: alert and oriented to person, place, and time. Attention span and concentration intact, recent and remote memory intact, fund of knowledge intact.  Speech fluent and not dysarthric, language intact.  CN II-XII intact. Fundoscopic exam unremarkable without vessel changes, exudates, hemorrhages or papilledema.  Bulk and tone normal, muscle strength 5/5 throughout.  Sensation to light touch, temperature and vibration intact.  Deep tendon reflexes 2+ throughout, toes downgoing.  Finger to  nose and heel to shin testing intact.  Gait normal, Romberg negative.  IMPRESSION: Tension-type headache, likely triggered by increased stress Pineal gland cysts  PLAN: 1.  May use Advil or Excedrin and/or cyclobenzaprine 2.  Repeat MRI of brain and pineal gland with and without contrast in March 2017 to follow up on cysts. 3.  Follow up after MRI.  Call with questions or concerns.  Shon Millet, DO  CC:  Blair Heys, MD

## 2015-03-06 NOTE — Patient Instructions (Signed)
1.  If you get a headache, may use Advil or Excedrin with cyclobenzaprine 2.  The pituitary gland cysts are likely benign.  But will repeat MRI in one year (March 2017) for follow up and follow up with me afterwards. 3.  Call with questions or concerns.

## 2015-07-08 ENCOUNTER — Other Ambulatory Visit: Payer: Self-pay

## 2015-07-08 DIAGNOSIS — Z1231 Encounter for screening mammogram for malignant neoplasm of breast: Secondary | ICD-10-CM

## 2015-07-15 ENCOUNTER — Ambulatory Visit
Admission: RE | Admit: 2015-07-15 | Discharge: 2015-07-15 | Disposition: A | Discharge status: Home / Self Care | Payer: BC Managed Care – PPO | Source: Ambulatory Visit

## 2015-07-15 DIAGNOSIS — Z1231 Encounter for screening mammogram for malignant neoplasm of breast: Secondary | ICD-10-CM

## 2015-08-03 ENCOUNTER — Other Ambulatory Visit (HOSPITAL_COMMUNITY)
Admission: RE | Admit: 2015-08-03 | Discharge: 2015-08-03 | Disposition: A | Discharge status: Home / Self Care | Payer: BC Managed Care – PPO | Source: Ambulatory Visit | Attending: Obstetrics and Gynecology | Admitting: Obstetrics and Gynecology

## 2015-08-03 ENCOUNTER — Other Ambulatory Visit: Payer: Self-pay | Admitting: Obstetrics and Gynecology

## 2015-08-03 DIAGNOSIS — Z1151 Encounter for screening for human papillomavirus (HPV): Secondary | ICD-10-CM | POA: Insufficient documentation

## 2015-08-03 DIAGNOSIS — R87810 Cervical high risk human papillomavirus (HPV) DNA test positive: Secondary | ICD-10-CM | POA: Diagnosis not present

## 2015-08-03 DIAGNOSIS — Z01411 Encounter for gynecological examination (general) (routine) with abnormal findings: Secondary | ICD-10-CM | POA: Insufficient documentation

## 2015-08-05 LAB — CYTOLOGY - PAP

## 2015-09-02 ENCOUNTER — Other Ambulatory Visit: Payer: Self-pay | Admitting: Obstetrics and Gynecology

## 2016-03-04 ENCOUNTER — Other Ambulatory Visit: Payer: Self-pay | Admitting: Obstetrics and Gynecology

## 2016-03-04 ENCOUNTER — Other Ambulatory Visit (HOSPITAL_COMMUNITY)
Admission: RE | Admit: 2016-03-04 | Discharge: 2016-03-04 | Disposition: A | Discharge status: Home / Self Care | Payer: BC Managed Care – PPO | Source: Ambulatory Visit | Attending: Obstetrics and Gynecology | Admitting: Obstetrics and Gynecology

## 2016-03-04 DIAGNOSIS — Z01411 Encounter for gynecological examination (general) (routine) with abnormal findings: Secondary | ICD-10-CM | POA: Insufficient documentation

## 2016-03-08 LAB — CYTOLOGY - PAP

## 2016-07-13 ENCOUNTER — Other Ambulatory Visit: Payer: Self-pay | Admitting: Obstetrics and Gynecology

## 2016-07-13 DIAGNOSIS — Z1231 Encounter for screening mammogram for malignant neoplasm of breast: Secondary | ICD-10-CM

## 2016-07-27 ENCOUNTER — Ambulatory Visit
Admission: RE | Admit: 2016-07-27 | Discharge: 2016-07-27 | Disposition: A | Discharge status: Home / Self Care | Payer: BC Managed Care – PPO | Source: Ambulatory Visit | Attending: Obstetrics and Gynecology | Admitting: Obstetrics and Gynecology

## 2016-07-27 DIAGNOSIS — Z1231 Encounter for screening mammogram for malignant neoplasm of breast: Secondary | ICD-10-CM

## 2016-09-02 ENCOUNTER — Other Ambulatory Visit: Payer: Self-pay | Admitting: Obstetrics and Gynecology

## 2016-09-02 ENCOUNTER — Other Ambulatory Visit (HOSPITAL_COMMUNITY)
Admission: RE | Admit: 2016-09-02 | Discharge: 2016-09-02 | Disposition: A | Discharge status: Home / Self Care | Payer: BC Managed Care – PPO | Source: Ambulatory Visit | Attending: Obstetrics and Gynecology | Admitting: Obstetrics and Gynecology

## 2016-09-02 DIAGNOSIS — Z1151 Encounter for screening for human papillomavirus (HPV): Secondary | ICD-10-CM | POA: Insufficient documentation

## 2016-09-02 DIAGNOSIS — Z01419 Encounter for gynecological examination (general) (routine) without abnormal findings: Secondary | ICD-10-CM | POA: Diagnosis present

## 2016-09-07 LAB — CYTOLOGY - PAP
DIAGNOSIS: NEGATIVE
HPV (WINDOPATH): NOT DETECTED

## 2017-07-19 ENCOUNTER — Other Ambulatory Visit: Payer: Self-pay | Admitting: Obstetrics and Gynecology

## 2017-07-19 DIAGNOSIS — Z1231 Encounter for screening mammogram for malignant neoplasm of breast: Secondary | ICD-10-CM

## 2017-08-07 ENCOUNTER — Ambulatory Visit
Admission: RE | Admit: 2017-08-07 | Discharge: 2017-08-07 | Disposition: A | Discharge status: Home / Self Care | Payer: BC Managed Care – PPO | Source: Ambulatory Visit | Attending: Obstetrics and Gynecology | Admitting: Obstetrics and Gynecology

## 2017-08-07 DIAGNOSIS — Z1231 Encounter for screening mammogram for malignant neoplasm of breast: Secondary | ICD-10-CM

## 2017-08-09 ENCOUNTER — Other Ambulatory Visit: Payer: Self-pay | Admitting: Obstetrics and Gynecology

## 2017-08-09 DIAGNOSIS — R928 Other abnormal and inconclusive findings on diagnostic imaging of breast: Secondary | ICD-10-CM

## 2017-08-10 ENCOUNTER — Ambulatory Visit
Admission: RE | Admit: 2017-08-10 | Discharge: 2017-08-10 | Disposition: A | Discharge status: Home / Self Care | Payer: BC Managed Care – PPO | Source: Ambulatory Visit | Attending: Obstetrics and Gynecology | Admitting: Obstetrics and Gynecology

## 2017-08-10 DIAGNOSIS — R928 Other abnormal and inconclusive findings on diagnostic imaging of breast: Secondary | ICD-10-CM

## 2017-10-25 ENCOUNTER — Other Ambulatory Visit: Payer: Self-pay | Admitting: Obstetrics and Gynecology

## 2017-10-25 ENCOUNTER — Other Ambulatory Visit (HOSPITAL_COMMUNITY)
Admission: RE | Admit: 2017-10-25 | Discharge: 2017-10-25 | Disposition: A | Discharge status: Home / Self Care | Payer: BC Managed Care – PPO | Source: Ambulatory Visit | Attending: Obstetrics and Gynecology | Admitting: Obstetrics and Gynecology

## 2017-10-25 DIAGNOSIS — Z124 Encounter for screening for malignant neoplasm of cervix: Secondary | ICD-10-CM | POA: Insufficient documentation

## 2017-10-31 LAB — CYTOLOGY - PAP
Diagnosis: NEGATIVE
HPV: NOT DETECTED

## 2018-07-12 ENCOUNTER — Other Ambulatory Visit: Payer: Self-pay | Admitting: Obstetrics and Gynecology

## 2018-07-12 DIAGNOSIS — Z1231 Encounter for screening mammogram for malignant neoplasm of breast: Secondary | ICD-10-CM

## 2018-08-21 ENCOUNTER — Ambulatory Visit
Admission: RE | Admit: 2018-08-21 | Discharge: 2018-08-21 | Disposition: A | Discharge status: Home / Self Care | Payer: BC Managed Care – PPO | Source: Ambulatory Visit | Attending: Obstetrics and Gynecology | Admitting: Obstetrics and Gynecology

## 2018-08-21 DIAGNOSIS — Z1231 Encounter for screening mammogram for malignant neoplasm of breast: Secondary | ICD-10-CM

## 2018-12-07 ENCOUNTER — Other Ambulatory Visit: Payer: Self-pay | Admitting: Obstetrics and Gynecology

## 2018-12-07 ENCOUNTER — Other Ambulatory Visit (HOSPITAL_COMMUNITY)
Admission: RE | Admit: 2018-12-07 | Discharge: 2018-12-07 | Disposition: A | Discharge status: Home / Self Care | Payer: BC Managed Care – PPO | Source: Ambulatory Visit | Attending: Obstetrics and Gynecology | Admitting: Obstetrics and Gynecology

## 2018-12-07 DIAGNOSIS — Z01419 Encounter for gynecological examination (general) (routine) without abnormal findings: Secondary | ICD-10-CM | POA: Diagnosis not present

## 2018-12-11 LAB — CYTOLOGY - PAP
Chlamydia: NEGATIVE
Diagnosis: NEGATIVE
HPV (WINDOPATH): NOT DETECTED
Neisseria Gonorrhea: NEGATIVE

## 2019-06-18 ENCOUNTER — Other Ambulatory Visit: Payer: Self-pay | Admitting: Family Medicine

## 2019-06-18 DIAGNOSIS — N644 Mastodynia: Secondary | ICD-10-CM

## 2019-06-24 ENCOUNTER — Ambulatory Visit
Admission: RE | Admit: 2019-06-24 | Discharge: 2019-06-24 | Disposition: A | Discharge status: Home / Self Care | Payer: BC Managed Care – PPO | Source: Ambulatory Visit | Attending: Family Medicine | Admitting: Family Medicine

## 2019-06-24 ENCOUNTER — Other Ambulatory Visit: Payer: Self-pay

## 2019-06-24 DIAGNOSIS — N644 Mastodynia: Secondary | ICD-10-CM

## 2019-07-22 ENCOUNTER — Other Ambulatory Visit: Payer: Self-pay | Admitting: Obstetrics and Gynecology

## 2019-07-22 DIAGNOSIS — Z1231 Encounter for screening mammogram for malignant neoplasm of breast: Secondary | ICD-10-CM

## 2019-09-09 ENCOUNTER — Ambulatory Visit
Admission: RE | Admit: 2019-09-09 | Discharge: 2019-09-09 | Disposition: A | Discharge status: Home / Self Care | Payer: BC Managed Care – PPO | Source: Ambulatory Visit | Attending: Obstetrics and Gynecology | Admitting: Obstetrics and Gynecology

## 2019-09-09 ENCOUNTER — Other Ambulatory Visit: Payer: Self-pay

## 2019-09-09 DIAGNOSIS — Z1231 Encounter for screening mammogram for malignant neoplasm of breast: Secondary | ICD-10-CM

## 2019-11-23 ENCOUNTER — Ambulatory Visit: Payer: BC Managed Care – PPO

## 2020-09-04 ENCOUNTER — Other Ambulatory Visit: Payer: Self-pay | Admitting: Obstetrics and Gynecology

## 2020-09-04 DIAGNOSIS — Z1231 Encounter for screening mammogram for malignant neoplasm of breast: Secondary | ICD-10-CM

## 2020-10-16 ENCOUNTER — Ambulatory Visit
Admission: RE | Admit: 2020-10-16 | Discharge: 2020-10-16 | Disposition: A | Discharge status: Home / Self Care | Payer: BC Managed Care – PPO | Source: Ambulatory Visit | Attending: Obstetrics and Gynecology | Admitting: Obstetrics and Gynecology

## 2020-10-16 ENCOUNTER — Other Ambulatory Visit: Payer: Self-pay

## 2020-10-16 DIAGNOSIS — Z1231 Encounter for screening mammogram for malignant neoplasm of breast: Secondary | ICD-10-CM

## 2020-10-19 ENCOUNTER — Other Ambulatory Visit: Payer: Self-pay | Admitting: Obstetrics and Gynecology

## 2020-10-19 DIAGNOSIS — R928 Other abnormal and inconclusive findings on diagnostic imaging of breast: Secondary | ICD-10-CM

## 2020-10-30 ENCOUNTER — Other Ambulatory Visit: Payer: Self-pay | Admitting: Obstetrics and Gynecology

## 2020-10-30 ENCOUNTER — Other Ambulatory Visit: Payer: Self-pay

## 2020-10-30 ENCOUNTER — Ambulatory Visit: Payer: BC Managed Care – PPO

## 2020-10-30 ENCOUNTER — Ambulatory Visit
Admission: RE | Admit: 2020-10-30 | Discharge: 2020-10-30 | Disposition: A | Discharge status: Home / Self Care | Payer: BC Managed Care – PPO | Source: Ambulatory Visit | Attending: Obstetrics and Gynecology | Admitting: Obstetrics and Gynecology

## 2020-10-30 DIAGNOSIS — R928 Other abnormal and inconclusive findings on diagnostic imaging of breast: Secondary | ICD-10-CM

## 2020-10-30 DIAGNOSIS — R921 Mammographic calcification found on diagnostic imaging of breast: Secondary | ICD-10-CM

## 2021-09-08 ENCOUNTER — Other Ambulatory Visit: Payer: Self-pay | Admitting: Nurse Practitioner

## 2021-09-08 DIAGNOSIS — N6312 Unspecified lump in the right breast, upper inner quadrant: Secondary | ICD-10-CM

## 2021-10-19 ENCOUNTER — Ambulatory Visit
Admission: RE | Admit: 2021-10-19 | Discharge: 2021-10-19 | Disposition: A | Discharge status: Home / Self Care | Payer: BC Managed Care – PPO | Source: Ambulatory Visit | Attending: Nurse Practitioner | Admitting: Nurse Practitioner

## 2021-10-19 DIAGNOSIS — N6312 Unspecified lump in the right breast, upper inner quadrant: Secondary | ICD-10-CM

## 2022-06-26 IMAGING — MG DIGITAL DIAGNOSTIC BILAT W/ TOMO W/ CAD
6 of 10 series · 6 of 30 positions shown · non-contrast
Comparison: Previous exam(s).

CLINICAL DATA: 45-year-old female presenting with a lump in the
right breast for a couple of months.

EXAM:
DIGITAL DIAGNOSTIC BILATERAL MAMMOGRAM WITH TOMOSYNTHESIS AND CAD;
ULTRASOUND RIGHT BREAST LIMITED
TECHNIQUE: Bilateral digital diagnostic mammography and breast tomosynthesis
was performed. The images were evaluated with computer-aided
detection.; Targeted ultrasound examination of the right breast was
performed

[L MLO synth-2D]
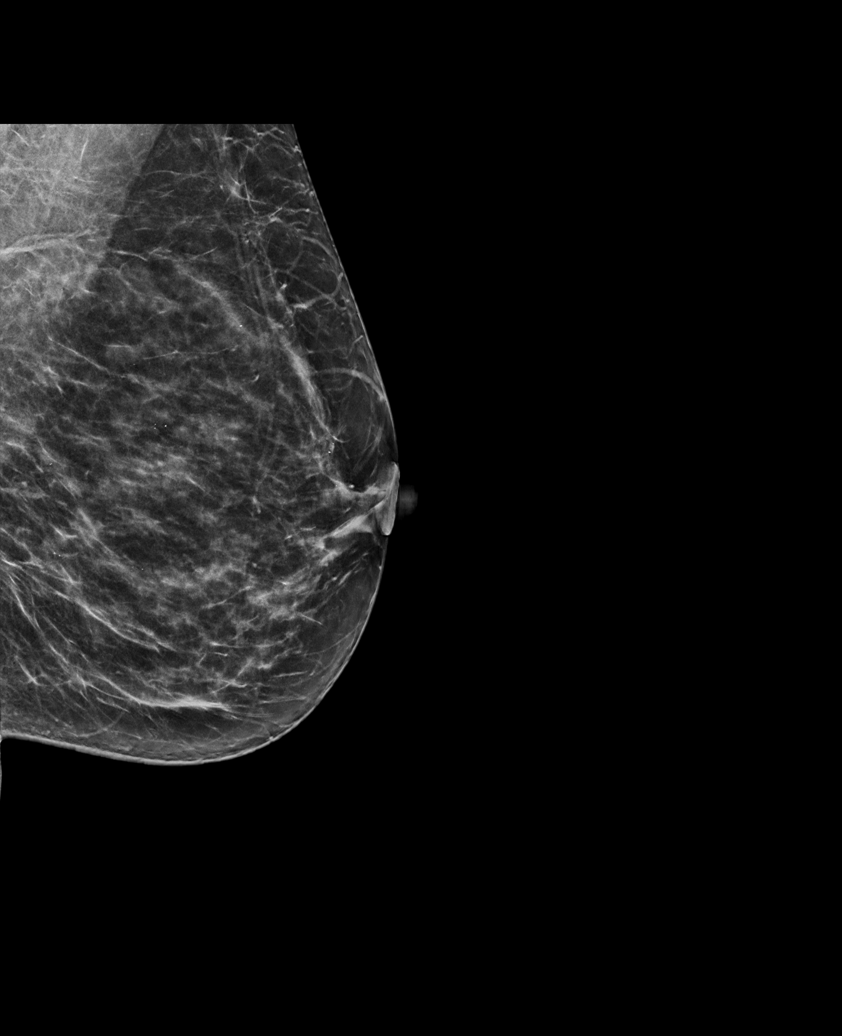

[R TAN synth-2D]
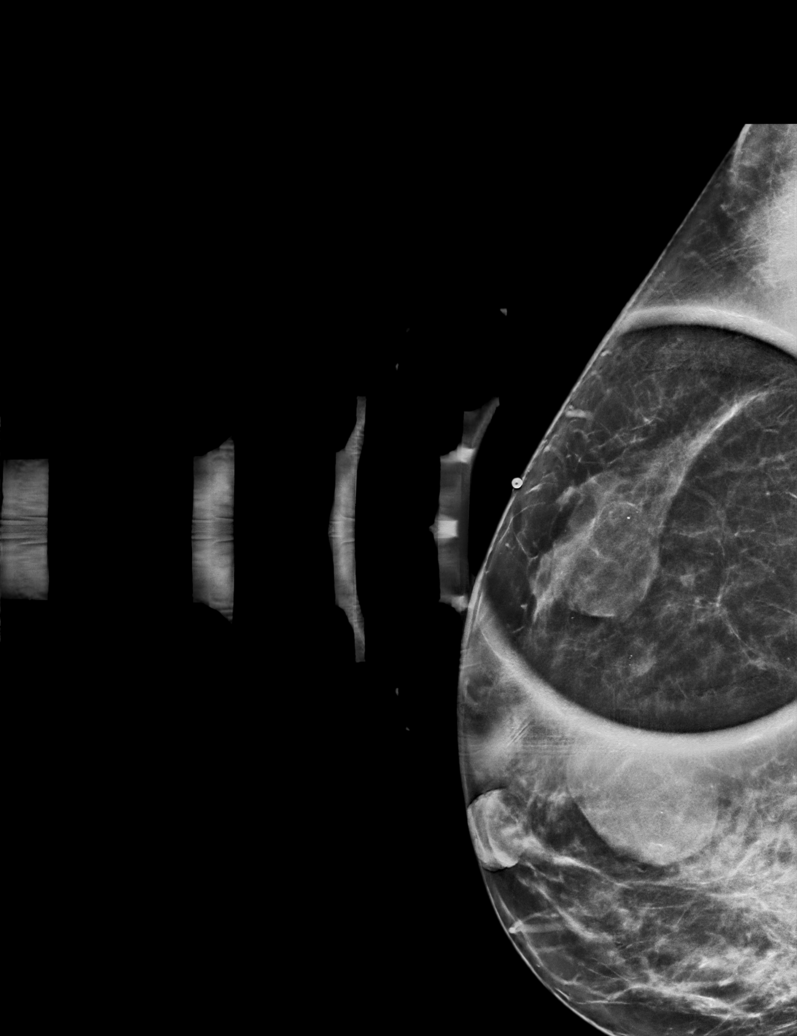

[R MLO synth-2D]
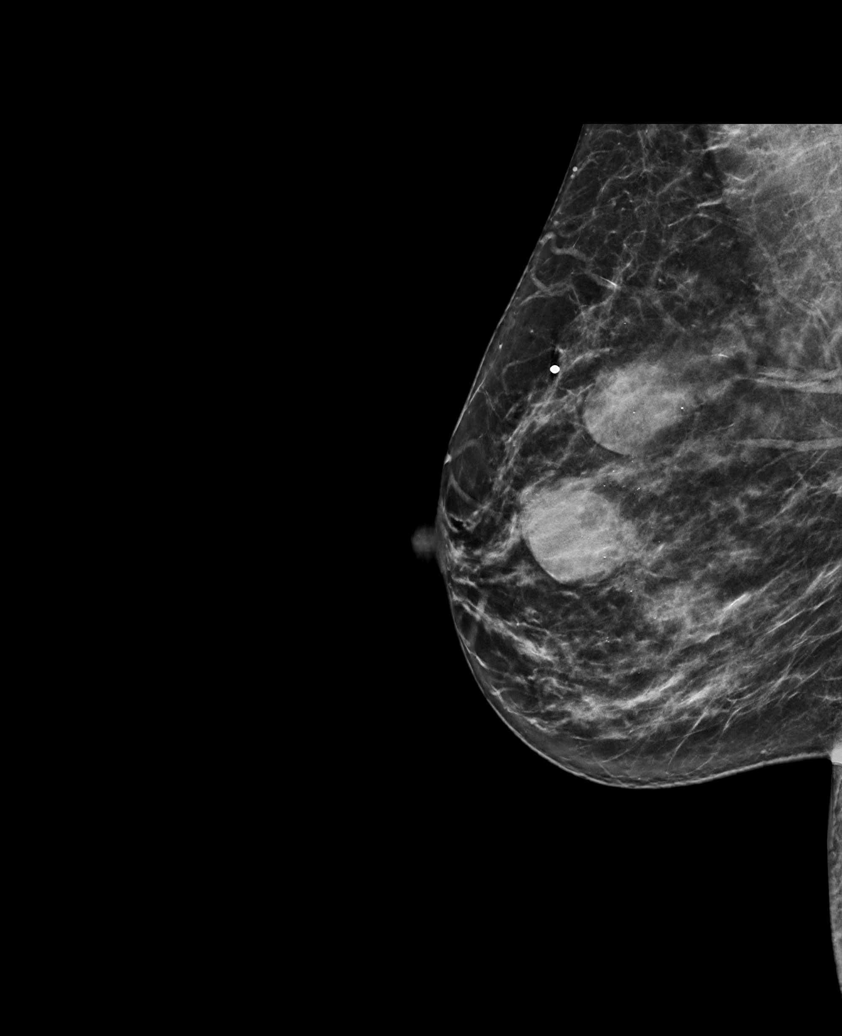

[L CC synth-2D]
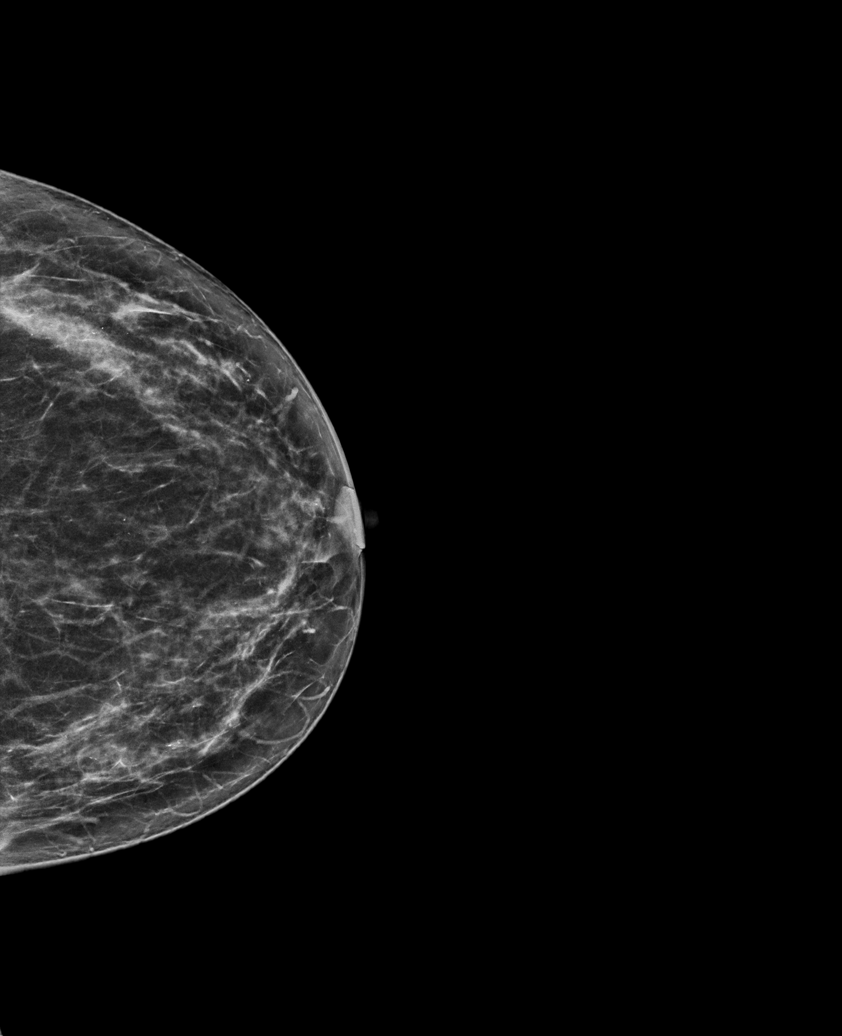

[R CC synth-2D]
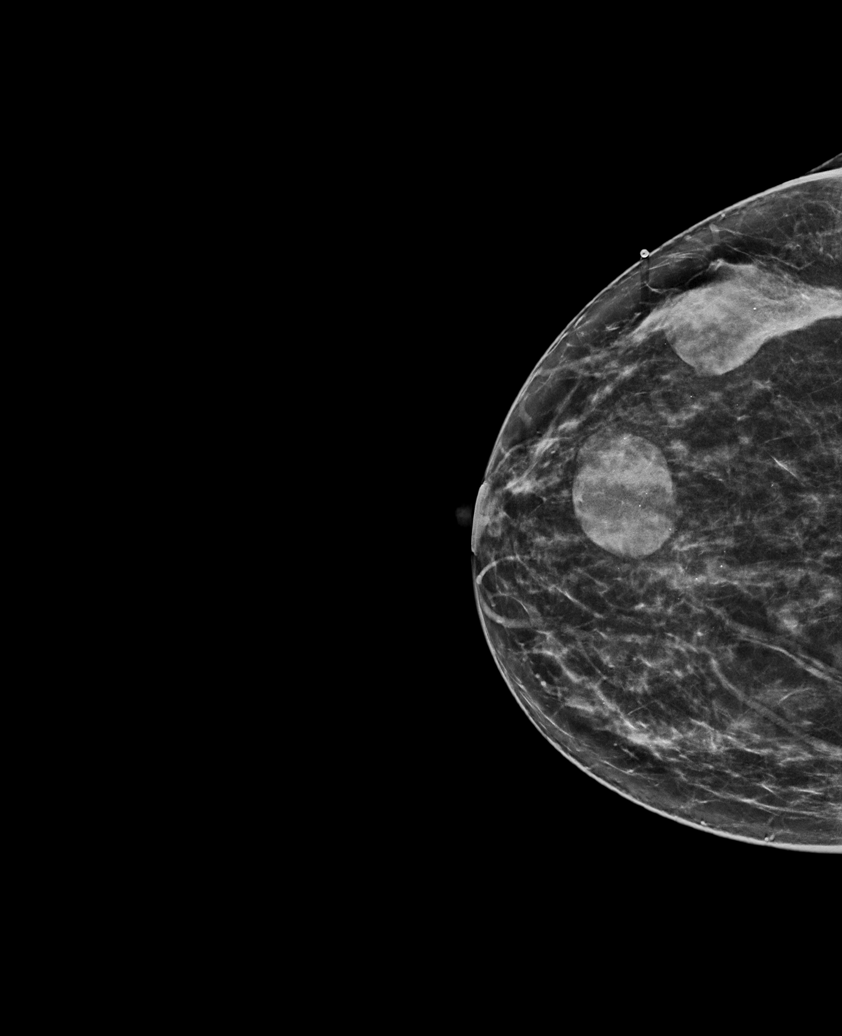

[R MLO tomo · tomo slice 35/70.0]
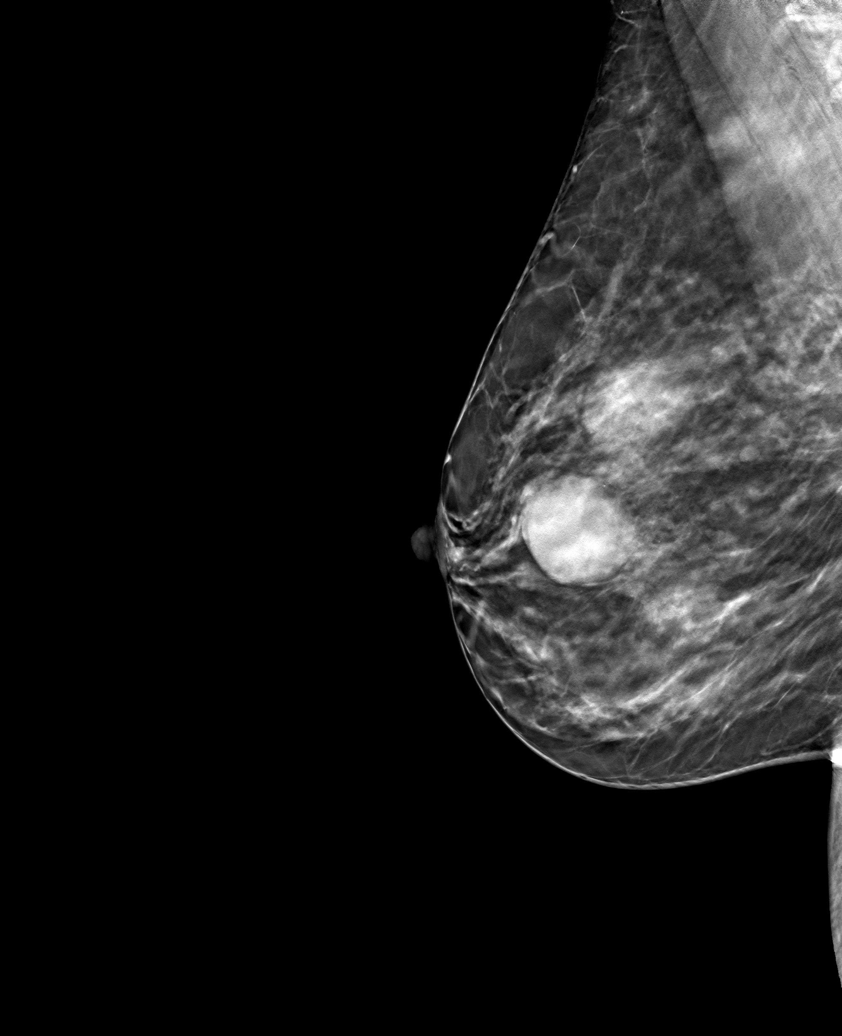

[6 of 30 positions shown; findings below may reference images not displayed]

ACR Breast Density Category b: There are scattered areas of
fibroglandular density.
FINDINGS: Mammogram:

Right breast: A skin BB marks the palpable site of concern reported
by the patient in the upper outer right breast. A spot tangential
view of this area was performed in addition to standard views
demonstrating an oval circumscribed mass measuring approximately
cm. There are no new suspicious findings elsewhere in the right
breast.

Left breast: No suspicious mass, distortion, or microcalcifications
are identified to suggest presence of malignancy.

On physical exam at the palpable site of concern I feel a discrete
mobile mass.

Ultrasound:

Targeted ultrasound performed at the palpable site of concern
reported by the patient in the right breast at 9:30 o'clock 4 cm
from the nipple demonstrating an oval circumscribed anechoic mass
measuring 1.3 x 1.5 x 2.6 cm with posterior enhancement, consistent
with a benign cyst. This corresponds to the mammographic finding.
IMPRESSION: 1. At the palpable site of concern in the right breast at 9:30
o'clock there is a benign simple cyst.

2.  No mammographic evidence of malignancy bilaterally.

RECOMMENDATION:
Screening mammogram in one year.(Code:[id number]-[id number])

I have discussed the findings and recommendations with the patient.
If applicable, a reminder letter will be sent to the patient
regarding the next appointment.

BI-RADS CATEGORY  2: Benign.

## 2022-11-14 ENCOUNTER — Other Ambulatory Visit: Payer: Self-pay | Admitting: Nurse Practitioner

## 2022-11-14 DIAGNOSIS — Z1231 Encounter for screening mammogram for malignant neoplasm of breast: Secondary | ICD-10-CM

## 2022-11-17 ENCOUNTER — Ambulatory Visit
Admission: RE | Admit: 2022-11-17 | Discharge: 2022-11-17 | Disposition: A | Discharge status: Home / Self Care | Payer: BC Managed Care – PPO | Source: Ambulatory Visit | Attending: Nurse Practitioner | Admitting: Nurse Practitioner

## 2022-11-17 DIAGNOSIS — Z1231 Encounter for screening mammogram for malignant neoplasm of breast: Secondary | ICD-10-CM

## 2023-02-28 ENCOUNTER — Other Ambulatory Visit: Payer: Self-pay | Admitting: Nurse Practitioner

## 2023-02-28 ENCOUNTER — Other Ambulatory Visit (HOSPITAL_COMMUNITY)
Admission: RE | Admit: 2023-02-28 | Discharge: 2023-02-28 | Disposition: A | Discharge status: Home / Self Care | Payer: BC Managed Care – PPO | Source: Ambulatory Visit | Attending: Nurse Practitioner | Admitting: Nurse Practitioner

## 2023-02-28 DIAGNOSIS — Z124 Encounter for screening for malignant neoplasm of cervix: Secondary | ICD-10-CM | POA: Diagnosis not present

## 2023-03-07 LAB — CYTOLOGY - PAP
Comment: NEGATIVE
Diagnosis: UNDETERMINED — AB
High risk HPV: NEGATIVE

## 2023-11-14 ENCOUNTER — Other Ambulatory Visit: Payer: Self-pay | Admitting: Nurse Practitioner

## 2023-11-14 DIAGNOSIS — Z1231 Encounter for screening mammogram for malignant neoplasm of breast: Secondary | ICD-10-CM

## 2023-11-23 ENCOUNTER — Ambulatory Visit
Admission: RE | Admit: 2023-11-23 | Discharge: 2023-11-23 | Disposition: A | Discharge status: Home / Self Care | Payer: BC Managed Care – PPO | Source: Ambulatory Visit | Attending: Nurse Practitioner | Admitting: Nurse Practitioner

## 2023-11-23 DIAGNOSIS — Z1231 Encounter for screening mammogram for malignant neoplasm of breast: Secondary | ICD-10-CM

## 2024-02-04 ENCOUNTER — Emergency Department (HOSPITAL_COMMUNITY)
Admission: EM | Admit: 2024-02-04 | Discharge: 2024-02-04 | Disposition: A | Discharge status: Home / Self Care | Attending: Emergency Medicine | Admitting: Emergency Medicine

## 2024-02-04 ENCOUNTER — Emergency Department (HOSPITAL_COMMUNITY)

## 2024-02-04 ENCOUNTER — Encounter (HOSPITAL_COMMUNITY): Payer: Self-pay

## 2024-02-04 ENCOUNTER — Other Ambulatory Visit: Payer: Self-pay

## 2024-02-04 DIAGNOSIS — M25511 Pain in right shoulder: Secondary | ICD-10-CM | POA: Insufficient documentation

## 2024-02-04 DIAGNOSIS — M549 Dorsalgia, unspecified: Secondary | ICD-10-CM | POA: Diagnosis not present

## 2024-02-04 DIAGNOSIS — M542 Cervicalgia: Secondary | ICD-10-CM | POA: Diagnosis not present

## 2024-02-04 DIAGNOSIS — Y92410 Unspecified street and highway as the place of occurrence of the external cause: Secondary | ICD-10-CM | POA: Diagnosis not present

## 2024-02-04 MED ORDER — CYCLOBENZAPRINE HCL 10 MG PO TABS: 10.0000 mg | ORAL_TABLET | Freq: Two times a day (BID) | ORAL | 0 refills | Status: AC | PRN

## 2024-02-04 MED ORDER — ACETAMINOPHEN 500 MG PO TABS
1000.0000 mg | ORAL_TABLET | Freq: Once | ORAL | Status: AC
  Administered 2024-02-04: 1000 mg via ORAL
  Filled 2024-02-04: qty 2

## 2024-02-04 MED ORDER — ACETAMINOPHEN 325 MG PO TABS: 650.0000 mg | ORAL_TABLET | Freq: Four times a day (QID) | ORAL | 0 refills | Status: AC | PRN

## 2024-02-04 MED ORDER — LIDOCAINE 5 % EX PTCH
1.0000 | MEDICATED_PATCH | Freq: Once | CUTANEOUS | Status: DC
  Administered 2024-02-04: 1 via TRANSDERMAL
  Filled 2024-02-04: qty 1

## 2024-02-04 MED ORDER — IBUPROFEN 600 MG PO TABS: 600.0000 mg | ORAL_TABLET | Freq: Four times a day (QID) | ORAL | 0 refills | Status: AC | PRN

## 2024-02-04 MED ORDER — IBUPROFEN 800 MG PO TABS
800.0000 mg | ORAL_TABLET | Freq: Once | ORAL | Status: AC
  Administered 2024-02-04: 800 mg via ORAL
  Filled 2024-02-04: qty 1

## 2024-02-04 NOTE — Discharge Instructions (Signed)
 It was a pleasure caring for you today in the emergency department.  Please return to the emergency department for any worsening or worrisome symptoms.
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
## 2024-02-04 NOTE — ED Notes (Signed)
 Patient transported to X-ray
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
